# Patient Record
Sex: Male | Born: 1985 | Race: White | Hispanic: No | Marital: Single | Smoking: Never smoker
Health system: Southern US, Community
[De-identification: ages and names within clinical notes are randomized; demographics above are authoritative.]

## PROBLEM LIST (undated history)

## (undated) DIAGNOSIS — N200 Calculus of kidney: Secondary | ICD-10-CM

---

## 2013-11-15 ENCOUNTER — Encounter (HOSPITAL_COMMUNITY): Payer: Self-pay | Admitting: Family Medicine

## 2013-11-15 ENCOUNTER — Emergency Department (HOSPITAL_COMMUNITY)
Admission: EM | Admit: 2013-11-15 | Discharge: 2013-11-15 | Disposition: A | Discharge status: Home / Self Care | Payer: Self-pay | Source: Home / Self Care | Attending: Family Medicine | Admitting: Family Medicine

## 2013-11-15 ENCOUNTER — Emergency Department (INDEPENDENT_AMBULATORY_CARE_PROVIDER_SITE_OTHER): Payer: Self-pay

## 2013-11-15 DIAGNOSIS — R1084 Generalized abdominal pain: Secondary | ICD-10-CM

## 2013-11-15 DIAGNOSIS — N200 Calculus of kidney: Secondary | ICD-10-CM

## 2013-11-15 LAB — POCT URINALYSIS DIP (DEVICE)
GLUCOSE, UA: NEGATIVE mg/dL
Ketones, ur: NEGATIVE mg/dL
Nitrite: NEGATIVE
PH: 6 (ref 5.0–8.0)
Protein, ur: 100 mg/dL — AB
SPECIFIC GRAVITY, URINE: 1.025 (ref 1.005–1.030)
Urobilinogen, UA: 2 mg/dL — ABNORMAL HIGH (ref 0.0–1.0)

## 2013-11-15 MED ORDER — TRAMADOL HCL 50 MG PO TABS: 50.0000 mg | ORAL_TABLET | Freq: Four times a day (QID) | ORAL | Status: AC | PRN

## 2013-11-15 MED ORDER — TAMSULOSIN HCL 0.4 MG PO CAPS: 0.4000 mg | ORAL_CAPSULE | Freq: Every day | ORAL | Status: DC

## 2013-11-15 MED ORDER — KETOROLAC TROMETHAMINE 60 MG/2ML IM SOLN
INTRAMUSCULAR | Status: AC
  Filled 2013-11-15: qty 2

## 2013-11-15 MED ORDER — KETOROLAC TROMETHAMINE 60 MG/2ML IM SOLN
60.0000 mg | Freq: Once | INTRAMUSCULAR | Status: AC
Start: 2013-11-15 — End: 2013-11-15
  Administered 2013-11-15: 60 mg via INTRAMUSCULAR

## 2013-11-15 NOTE — Discharge Instructions (Signed)
Your pain is most likely due to a kidney stone  This will improve once the stone is passed Please strain your urine Please start ibuprofen 600-800mg  every 6-8 hours starting tomorrow morning Please use the tramadol as needed for additional pain Please start the flomax to help flush out the stone Please drink lots of water.  Come back if you are not getting better.    Dietary Guidelines to Help Prevent Kidney Stones Your risk of kidney stones can be decreased by adjusting the foods you eat. The most important thing you can do is drink enough fluid. You should drink enough fluid to keep your urine clear or pale yellow. The following guidelines provide specific information for the type of kidney stone you have had. GUIDELINES ACCORDING TO TYPE OF KIDNEY STONE Calcium Oxalate Kidney Stones  Reduce the amount of salt you eat. Foods that have a lot of salt cause your body to release excess calcium into your urine. The excess calcium can combine with a substance called oxalate to form kidney stones.  Reduce the amount of animal protein you eat if the amount you eat is excessive. Animal protein causes your body to release excess calcium into your urine. Ask your dietitian how much protein from animal sources you should be eating.  Avoid foods that are high in oxalates. If you take vitamins, they should have less than 500 mg of vitamin C. Your body turns vitamin C into oxalates. You do not need to avoid fruits and vegetables high in vitamin C. Calcium Phosphate Kidney Stones  Reduce the amount of salt you eat to help prevent the release of excess calcium into your urine.  Reduce the amount of animal protein you eat if the amount you eat is excessive. Animal protein causes your body to release excess calcium into your urine. Ask your dietitian how much protein from animal sources you should be eating.  Get enough calcium from food or take a calcium supplement (ask your dietitian for recommendations).  Food sources of calcium that do not increase your risk of kidney stones include:  Broccoli.  Dairy products, such as cheese and yogurt.  Pudding. Uric Acid Kidney Stones  Do not have more than 6 oz of animal protein per day. FOOD SOURCES Animal Protein Sources  Meat (all types).  Poultry.  Eggs.  Fish, seafood. Foods High in MirantSalt  Salt seasonings.  Soy sauce.  Teriyaki sauce.  Cured and processed meats.  Salted crackers and snack foods.  Fast food.  Canned soups and most canned foods. Foods High in Oxalates  Grains:  Amaranth.  Barley.  Grits.  Wheat germ.  Bran.  Buckwheat flour.  All bran cereals.  Pretzels.  Whole wheat bread.  Vegetables:  Beans (wax).  Beets and beet greens.  Collard greens.  Eggplant.  Escarole.  Leeks.  Okra.  Parsley.  Rutabagas.  Spinach.  Swiss chard.  Tomato paste.  Fried potatoes.  Sweet potatoes.  Fruits:  Red currants.  Figs.  Kiwi.  Rhubarb.  Meat and Other Protein Sources:  Beans (dried).  Soy burgers and other soybean products.  Miso.  Nuts (peanuts, almonds, pecans, cashews, hazelnuts).  Nut butters.  Sesame seeds and tahini (paste made of sesame seeds).  Poppy seeds.  Beverages:  Chocolate drink mixes.  Soy milk.  Instant iced tea.  Juices made from high-oxalate fruits or vegetables.  Other:  Carob.  Chocolate.  Fruitcake.  Marmalades. Document Released: 08/01/2010 Document Revised: 04/11/2013 Document Reviewed: 03/03/2013 Exit[employer] Patient Information  2015 [employer]. This information is not intended to replace advice given to you by your health care provider. Make sure you discuss any questions you have with your health care provider. ° °

## 2013-11-15 NOTE — ED Notes (Signed)
Pt c/o intermittent right side back pain x3 days Pain has been more constant today; increases w/activity Sx also include vomiting and urinary urgency Denies hematuria, dysuria, f/d Alert w/no signs of acute distress.

## 2013-11-15 NOTE — ED Provider Notes (Signed)
CSN: [id number]     Arrival date & time 11/15/13  0944 History   First MD Initiated Contact with Patient 11/15/13 [phone]     Chief Complaint  Patient presents with  . Back Pain   (Consider location/radiation/quality/duration/timing/severity/associated sxs/prior Treatment) HPI  R flank pain: started Monday. Intermittent first but now constant. Sharp in nature. Worse w/ movement. Some radiation around r side to front of belly. Subjective fevers. Ibuprofen at 04:30 w/ improvement. Initially w/ frequency but no longer. Deneis diarrhea, constipation, penile discharge.    History reviewed. No pertinent past medical history. History reviewed. No pertinent past surgical history. No family history on file. History  Substance Use Topics  . Smoking status: Never Smoker   . Smokeless tobacco: Not on file  . Alcohol Use: No    Review of Systems Per HPI with all other pertinent systems negative.   Allergies  Review of patient's allergies indicates no known allergies.  Home Medications   Prior to Admission medications   Medication Sig Start Date End Date Taking? Authorizing Provider  tamsulosin (FLOMAX) 0.4 MG CAPS capsule Take 1 capsule (0.4 mg total) by mouth daily. 11/15/13   Ozella Rocksavid J Taim Wurm, MD  traMADol (ULTRAM) 50 MG tablet Take 1 tablet (50 mg total) by mouth every 6 (six) hours as needed. 11/15/13   Ozella Rocksavid J Kimbery Harwood, MD   BP 164/97  Pulse 68  Temp(Src) 98.3 F (36.8 C) (Oral)  Resp 14  SpO2 98% Physical Exam  Constitutional: He appears well-developed and well-nourished. No distress.  HENT:  Head: Normocephalic and atraumatic.  Eyes: EOM are normal. Pupils are equal, round, and reactive to light.  Neck: Normal range of motion.  Cardiovascular: Normal rate, normal heart sounds and intact distal pulses.   Pulmonary/Chest: Effort normal and breath sounds normal.  Abdominal: Soft. Bowel sounds are normal. He exhibits no distension and no mass. There is no tenderness. There is no  rebound and no guarding.  Musculoskeletal: Normal range of motion. He exhibits no edema and no tenderness.  Neurological: He is alert.  Skin: Skin is warm. He is not diaphoretic.  Psychiatric: He has a normal mood and affect. His behavior is normal. Thought content normal.    ED Course  Procedures (including critical care time) Labs Review Labs Reviewed  POCT URINALYSIS DIP (DEVICE) - Abnormal; Notable for the following:    Bilirubin Urine SMALL (*)    Hgb urine dipstick LARGE (*)    Protein, ur 100 (*)    Urobilinogen, UA 2.0 (*)    Leukocytes, UA TRACE (*)    All other components within normal limits    Imaging Review No results found.   MDM   1. Kidney stone   2. Generalized abdominal pain    Abd pain most likely from R kidney stone. Strong fmhx of renal stones. UA +. toradol much improved after toradol 60. AXR unremarkable. Start flomax Tramadol PRN pain Urine strainer provided Handout on renal stones given Precautions given and all questions answered  Shelly Flattenavid Shalini Mair, MD Family Medicine 11/15/2013, 10:19 AM       Ozella Rocksavid J Jesscia Imm, MD 11/15/13 1045

## 2015-08-03 IMAGING — CR DG ABDOMEN 1V
2 series · 2 of 2 positions shown · non-contrast
Comparison: None.

CLINICAL DATA: Right-sided back pain

EXAM:
ABDOMEN - 1 VIEW

[view not recorded (1 of 2)]
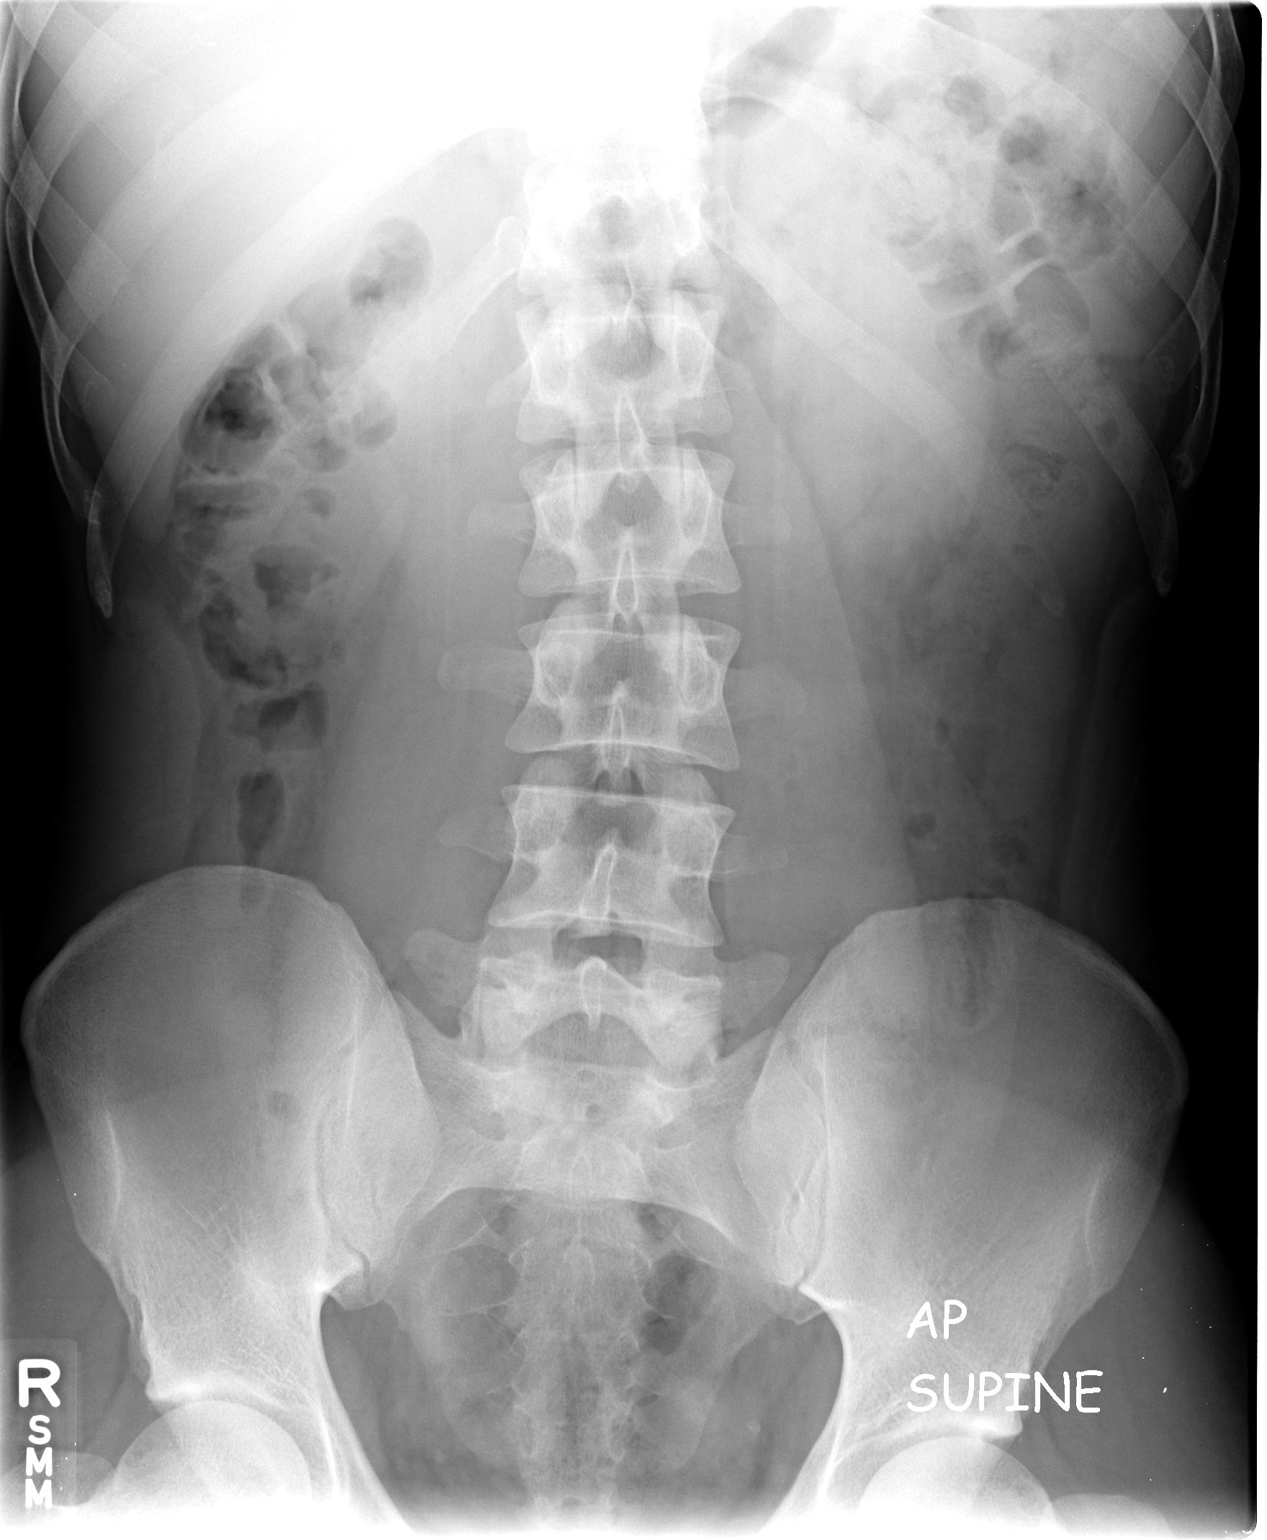

[view not recorded (2 of 2)]
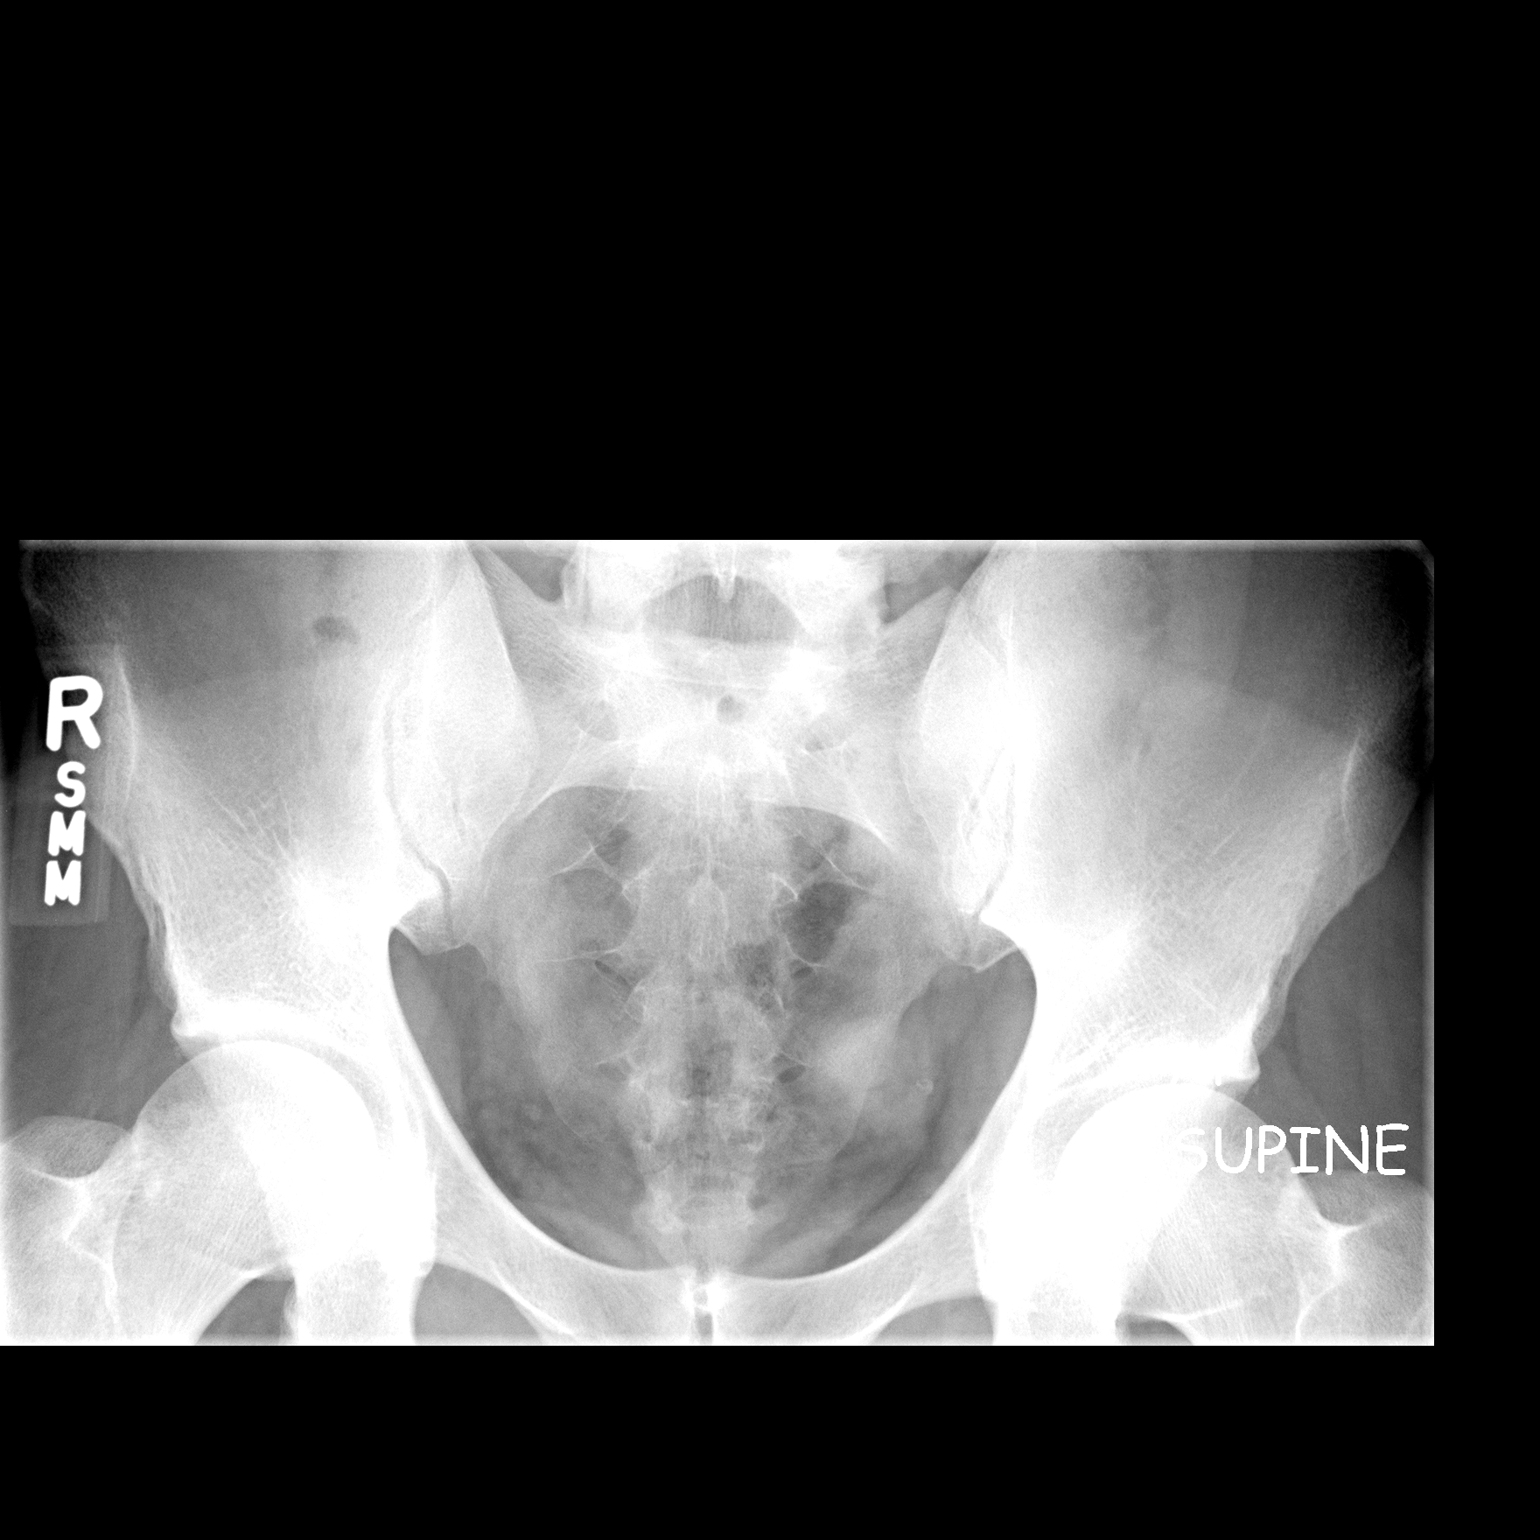

[2 of 2 positions shown; findings below may reference images not displayed]

FINDINGS: Bowel gas pattern is normal without evidence of ileus, obstruction
or free air. No abnormal calcifications overlie the abdomen. In the
pelvis, there is a single calcification on each side that appears
typical of a phlebolith. There is mild curvature of the spine.
IMPRESSION: No acute finding. Single calcification in each side of the pelvis
typical of phleboliths.

## 2018-01-17 ENCOUNTER — Emergency Department (HOSPITAL_COMMUNITY): Payer: 59

## 2018-01-17 ENCOUNTER — Other Ambulatory Visit: Payer: Self-pay

## 2018-01-17 ENCOUNTER — Emergency Department (HOSPITAL_COMMUNITY)
Admission: EM | Admit: 2018-01-17 | Discharge: 2018-01-17 | Disposition: A | Discharge status: Home / Self Care | Payer: 59 | Attending: Emergency Medicine | Admitting: Emergency Medicine

## 2018-01-17 ENCOUNTER — Encounter (HOSPITAL_COMMUNITY): Payer: Self-pay | Admitting: Emergency Medicine

## 2018-01-17 DIAGNOSIS — N201 Calculus of ureter: Secondary | ICD-10-CM | POA: Insufficient documentation

## 2018-01-17 DIAGNOSIS — Z79899 Other long term (current) drug therapy: Secondary | ICD-10-CM | POA: Diagnosis not present

## 2018-01-17 DIAGNOSIS — R109 Unspecified abdominal pain: Secondary | ICD-10-CM | POA: Diagnosis present

## 2018-01-17 DIAGNOSIS — T675XXA Heat exhaustion, unspecified, initial encounter: Secondary | ICD-10-CM | POA: Insufficient documentation

## 2018-01-17 HISTORY — DX: Calculus of kidney: N20.0

## 2018-01-17 LAB — URINALYSIS, ROUTINE W REFLEX MICROSCOPIC
Bacteria, UA: NONE SEEN
Glucose, UA: NEGATIVE mg/dL
KETONES UR: NEGATIVE mg/dL
LEUKOCYTES UA: NEGATIVE
Nitrite: NEGATIVE
PROTEIN: 30 mg/dL — AB
Specific Gravity, Urine: 1.035 — ABNORMAL HIGH (ref 1.005–1.030)
pH: 6 (ref 5.0–8.0)

## 2018-01-17 LAB — CBC WITH DIFFERENTIAL/PLATELET
Abs Immature Granulocytes: 0.1 10*3/uL (ref 0.0–0.1)
BASOS ABS: 0 10*3/uL (ref 0.0–0.1)
Basophils Relative: 0 %
EOS PCT: 1 %
Eosinophils Absolute: 0.1 10*3/uL (ref 0.0–0.7)
HCT: 44.1 % (ref 39.0–52.0)
Hemoglobin: 15.2 g/dL (ref 13.0–17.0)
IMMATURE GRANULOCYTES: 0 %
LYMPHS ABS: 1.1 10*3/uL (ref 0.7–4.0)
LYMPHS PCT: 8 %
MCH: 30.2 pg (ref 26.0–34.0)
MCHC: 34.5 g/dL (ref 30.0–36.0)
MCV: 87.5 fL (ref 78.0–100.0)
MONO ABS: 0.7 10*3/uL (ref 0.1–1.0)
MONOS PCT: 5 %
Neutro Abs: 11.4 10*3/uL — ABNORMAL HIGH (ref 1.7–7.7)
Neutrophils Relative %: 86 %
Platelets: 230 10*3/uL (ref 150–400)
RBC: 5.04 MIL/uL (ref 4.22–5.81)
RDW: 12.6 % (ref 11.5–15.5)
WBC: 13.4 10*3/uL — AB (ref 4.0–10.5)

## 2018-01-17 LAB — COMPREHENSIVE METABOLIC PANEL
ALT: 71 U/L — ABNORMAL HIGH (ref 0–44)
AST: 32 U/L (ref 15–41)
Albumin: 4.7 g/dL (ref 3.5–5.0)
Alkaline Phosphatase: 67 U/L (ref 38–126)
Anion gap: 7 (ref 5–15)
BUN: 13 mg/dL (ref 6–20)
CHLORIDE: 104 mmol/L (ref 98–111)
CO2: 25 mmol/L (ref 22–32)
CREATININE: 1.5 mg/dL — AB (ref 0.61–1.24)
Calcium: 10 mg/dL (ref 8.9–10.3)
GFR calc Af Amer: >60 mL/min (ref >60)
GFR calc non Af Amer: >60 mL/min (ref >60)
GLUCOSE: 159 mg/dL — AB (ref 70–99)
POTASSIUM: 5.3 mmol/L — AB (ref 3.5–5.1)
Sodium: 136 mmol/L (ref 135–145)
Total Bilirubin: 1.2 mg/dL (ref 0.3–1.2)
Total Protein: 7.2 g/dL (ref 6.5–8.1)

## 2018-01-17 LAB — CK: CK TOTAL: 541 U/L — AB (ref 49–397)

## 2018-01-17 MED ORDER — TAMSULOSIN HCL 0.4 MG PO CAPS: ORAL_CAPSULE | ORAL | 0 refills | Status: AC

## 2018-01-17 MED ORDER — SODIUM CHLORIDE 0.9 % IV BOLUS
500.0000 mL | Freq: Once | INTRAVENOUS | Status: AC
  Administered 2018-01-17: 500 mL via INTRAVENOUS

## 2018-01-17 MED ORDER — ONDANSETRON HCL 4 MG/2ML IJ SOLN
4.0000 mg | Freq: Once | INTRAMUSCULAR | Status: AC
  Administered 2018-01-17: 4 mg via INTRAVENOUS
  Filled 2018-01-17: qty 2

## 2018-01-17 MED ORDER — HYDROCODONE-ACETAMINOPHEN 5-325 MG PO TABS: 2.0000 | ORAL_TABLET | Freq: Four times a day (QID) | ORAL | 0 refills | Status: AC | PRN

## 2018-01-17 MED ORDER — SODIUM CHLORIDE 0.9 % IV BOLUS
1000.0000 mL | Freq: Once | INTRAVENOUS | Status: AC
Start: 2018-01-17 — End: 2018-01-17
  Administered 2018-01-17: 1000 mL via INTRAVENOUS

## 2018-01-17 MED ORDER — SODIUM CHLORIDE 0.9 % IV BOLUS
1000.0000 mL | Freq: Once | INTRAVENOUS | Status: AC
  Administered 2018-01-17: 1000 mL via INTRAVENOUS

## 2018-01-17 MED ORDER — KETOROLAC TROMETHAMINE 30 MG/ML IJ SOLN
30.0000 mg | Freq: Once | INTRAMUSCULAR | Status: AC
  Administered 2018-01-17: 30 mg via INTRAVENOUS
  Filled 2018-01-17: qty 1

## 2018-01-17 MED ORDER — ONDANSETRON HCL 4 MG PO TABS: 4.0000 mg | ORAL_TABLET | Freq: Three times a day (TID) | ORAL | 0 refills | Status: AC | PRN

## 2018-01-17 NOTE — ED Notes (Signed)
Patient transported to CT 

## 2018-01-17 NOTE — ED Provider Notes (Signed)
[facility] EMERGENCY DEPARTMENT Provider Note   CSN: [id number] Arrival date & time: 01/17/18  0236  Time seen 03:22 AM   History   Chief Complaint Chief Complaint  Patient presents with  . Flank Pain    HPI Andrew Bauer is a 32 y.o. male.  HPI patient worked outside today in [employer] and it was hot and humid all day.  He presents with some sunburn and states he got overheated today.  He states about mid day he started having some left flank pain that would sometimes radiate into his abdomen however is not doing that in the last several hours.  He states the pain is constant and he describes as a pressure pain.  He has nausea and is vomited twice.  He denies diarrhea or hematuria.  He states laying on his left side makes the pain worse, pacing around makes the pain better.  He states he has had kidney stones twice before and he passed them without intervention.  He does not have a urologist.  PCP Patient, No Pcp Per   Past Medical History:  Diagnosis Date  . Kidney stones     There are no active problems to display for this patient.   History reviewed. No pertinent surgical history.      Home Medications    Prior to Admission medications   Medication Sig Start Date End Date Taking? Authorizing Provider  ibuprofen (ADVIL,MOTRIN) 200 MG tablet Take 200-800 mg by mouth every 6 (six) hours as needed for moderate pain.   Yes [provider]  HYDROcodone-acetaminophen (NORCO/VICODIN) 5-325 MG tablet Take 2 tablets by mouth every 6 (six) hours as needed for moderate pain. 01/17/18   Devoria Albe, MD  ondansetron (ZOFRAN) 4 MG tablet Take 1 tablet (4 mg total) by mouth every 8 (eight) hours as needed for nausea or vomiting. 01/17/18   Devoria Albe, MD  tamsulosin (FLOMAX) 0.4 MG CAPS capsule Take 1 po QD until you pass the stone. 01/17/18   Devoria Albe, MD  traMADol (ULTRAM) 50 MG tablet Take 1 tablet (50 mg total) by mouth every 6 (six) hours as  needed. Patient not taking: Reported on 01/17/2018 11/15/13   Ozella Rocks, MD    Family History No family history on file.  Social History Social History   Tobacco Use  . Smoking status: Never Smoker  . Smokeless tobacco: Never Used  Substance Use Topics  . Alcohol use: Yes  . Drug use: Never  employed Lives with spouse   Allergies   Patient has no known allergies.   Review of Systems Review of Systems  All other systems reviewed and are negative.    Physical Exam Updated Vital Signs BP 124/86 (BP Location: Left Arm)   Pulse 73   Temp 97.9 F (36.6 C) (Oral)   Resp 16   SpO2 96%   Physical Exam  Constitutional: He is oriented to person, place, and time. He appears well-developed and well-nourished.  Non-toxic appearance. He does not appear ill. No distress.  Patient is laying quietly, he is slow in responding to questions.  HENT:  Head: Normocephalic and atraumatic.  Right Ear: External ear normal.  Left Ear: External ear normal.  Nose: Nose normal. No mucosal edema or rhinorrhea.  Mouth/Throat: Oropharynx is clear and moist and mucous membranes are normal. No dental abscesses or uvula swelling.  Eyes: Pupils are equal, round, and reactive to light. Conjunctivae and EOM are normal.  Neck: Normal range of motion  and full passive range of motion without pain. Neck supple.  Cardiovascular: Normal rate, regular rhythm and normal heart sounds. Exam reveals no gallop and no friction rub.  No murmur heard. Pulmonary/Chest: Effort normal and breath sounds normal. No respiratory distress. He has no wheezes. He has no rhonchi. He has no rales. He exhibits no tenderness and no crepitus.  Abdominal: Soft. Normal appearance and bowel sounds are normal. He exhibits no distension. There is no tenderness. There is no rebound and no guarding.  Patient has some pain in the superior aspect of his left flank.  Musculoskeletal: Normal range of motion. He exhibits no edema or  tenderness.  Moves all extremities well.   Neurological: He is alert and oriented to person, place, and time. He has normal strength. No cranial nerve deficit.  Skin: Skin is warm, dry and intact. No rash noted. No erythema. No pallor.  Patient has diffuse redness of his face consistent with sunburn.  Psychiatric: He has a normal mood and affect. His speech is normal and behavior is normal. His mood appears not anxious.  Nursing note and vitals reviewed.    ED Treatments / Results  Labs (all labs ordered are listed, but only abnormal results are displayed) Results for orders placed or performed during the hospital encounter of 01/17/18  Urinalysis, Routine w reflex microscopic- may I&O cath if menses  Result Value Ref Range   Color, Urine YELLOW YELLOW   APPearance CLEAR CLEAR   Specific Gravity, Urine 1.035 (H) 1.005 - 1.030   pH 6.0 5.0 - 8.0   Glucose, UA NEGATIVE NEGATIVE mg/dL   Hgb urine dipstick MODERATE (A) NEGATIVE   Bilirubin Urine SMALL (A) NEGATIVE   Ketones, ur NEGATIVE NEGATIVE mg/dL   Protein, ur 30 (A) NEGATIVE mg/dL   Nitrite NEGATIVE NEGATIVE   Leukocytes, UA NEGATIVE NEGATIVE   RBC / HPF >50 (H) 0 - 5 RBC/hpf   WBC, UA 0-5 0 - 5 WBC/hpf   Bacteria, UA NONE SEEN NONE SEEN   Mucus PRESENT   Comprehensive metabolic panel  Result Value Ref Range   Sodium 136 135 - 145 mmol/L   Potassium 5.3 (H) 3.5 - 5.1 mmol/L   Chloride 104 98 - 111 mmol/L   CO2 25 22 - 32 mmol/L   Glucose, Bld 159 (H) 70 - 99 mg/dL   BUN 13 6 - 20 mg/dL   Creatinine, Ser 1.61 (H) 0.61 - 1.24 mg/dL   Calcium 09.6 8.9 - 04.5 mg/dL   Total Protein 7.2 6.5 - 8.1 g/dL   Albumin 4.7 3.5 - 5.0 g/dL   AST 32 15 - 41 U/L   ALT 71 (H) 0 - 44 U/L   Alkaline Phosphatase 67 38 - 126 U/L   Total Bilirubin 1.2 0.3 - 1.2 mg/dL   GFR calc non Af Amer >60 >60 mL/min   GFR calc Af Amer >60 >60 mL/min   Anion gap 7 5 - 15  CBC with Differential  Result Value Ref Range   WBC 13.4 (H) 4.0 - 10.5 K/uL    RBC 5.04 4.22 - 5.81 MIL/uL   Hemoglobin 15.2 13.0 - 17.0 g/dL   HCT 40.9 81.1 - 91.4 %   MCV 87.5 78.0 - 100.0 fL   MCH 30.2 26.0 - 34.0 pg   MCHC 34.5 30.0 - 36.0 g/dL   RDW 78.2 95.6 - 21.3 %   Platelets 230 150 - 400 K/uL   Neutrophils Relative % 86 %   Neutro Abs  11.4 (H) 1.7 - 7.7 K/uL   Lymphocytes Relative 8 %   Lymphs Abs 1.1 0.7 - 4.0 K/uL   Monocytes Relative 5 %   Monocytes Absolute 0.7 0.1 - 1.0 K/uL   Eosinophils Relative 1 %   Eosinophils Absolute 0.1 0.0 - 0.7 K/uL   Basophils Relative 0 %   Basophils Absolute 0.0 0.0 - 0.1 K/uL   Immature Granulocytes 0 %   Abs Immature Granulocytes 0.1 0.0 - 0.1 K/uL  CK  Result Value Ref Range   Total CK 541 (H) 49 - 397 U/L   Laboratory interpretation all normal except concentrated urine consistent with dehydration, hematuria consistent with possible kidney stone, mild renal insufficiency consistent with dehydration, CK elevated but not in the range to be concern for rhabdomyolysis    EKG None  Radiology Ct Renal Stone Study  Result Date: 01/17/2018 CLINICAL DATA:  Left flank pain.  Recurrent stone disease suspected. EXAM: CT ABDOMEN AND PELVIS WITHOUT CONTRAST TECHNIQUE: Multidetector CT imaging of the abdomen and pelvis was performed following the standard protocol without IV contrast. COMPARISON:  Abdominal radiograph 11/15/2013 FINDINGS: Lower chest: Linear atelectasis in both lower lobes and right middle lobe. Hepatobiliary: No focal liver abnormality is seen. No gallstones, gallbladder wall thickening, or biliary dilatation. Pancreas: No ductal dilatation or inflammation. Spleen: Mild splenomegaly with spleen measuring 13.9 x 14 cm AP by craniocaudal dimension. No focal lesion on noncontrast exam. Adrenals/Urinary Tract: Normal adrenal glands. 2 mm obstructing stone just proximal to the left ureterovesicular junction with mild to moderate hydroureteronephrosis and perinephric edema. Punctate nonobstructing stone in the  interpolar left kidney. Small nonobstructing stones in the right kidney, largest measuring 4 mm. No right hydronephrosis. Urinary bladder is nondistended, no bladder stone. Stomach/Bowel: Stomach is within normal limits. Appendix appears normal, air-filled extending into the right mid abdomen. No evidence of bowel wall thickening, distention, or inflammatory changes. Vascular/Lymphatic: Normal caliber abdominopelvic vessels. No adenopathy. Reproductive: Prostate is unremarkable. Other: No free air, free fluid, or intra-abdominal fluid collection. Musculoskeletal: There are no acute or suspicious osseous abnormalities. IMPRESSION: 1. Obstructing 3 mm stone just proximal to the left ureterovesicular junction with mild to moderate hydroureteronephrosis. 2. Small additional nonobstructing bilateral renal stones. 3. Mild splenomegaly. Electronically Signed   By: Narda Rutherford M.D.   On: 01/17/2018 04:33    Procedures Procedures (including critical care time)  Medications Ordered in ED Medications  sodium chloride 0.9 % bolus 1,000 mL (1,000 mLs Intravenous New Bag/Given 01/17/18 0546)  sodium chloride 0.9 % bolus 1,000 mL (0 mLs Intravenous Stopped 01/17/18 0453)  sodium chloride 0.9 % bolus 500 mL (0 mLs Intravenous Stopped 01/17/18 0453)  ondansetron (ZOFRAN) injection 4 mg (4 mg Intravenous Given 01/17/18 0354)  ketorolac (TORADOL) 30 MG/ML injection 30 mg (30 mg Intravenous Given 01/17/18 0354)     Initial Impression / Assessment and Plan / ED Course  I have reviewed the triage vital signs and the nursing notes.  Pertinent labs & imaging results that were available during my care of the patient were reviewed by me and considered in my medical decision making (see chart for details).     Although fluids not generally given to patients with a kidney stone patient appears to be suffering from some heat related illness.  He was given IV fluids, IV nausea medication.  He was given IV Toradol for his  kidney stone pain.  When I review his old chart he only has a prior abdominal x-ray from July 2015.  CT renal scan was done.  05:10 AM pt sleeping, awakened and states his pain is gone. I discussed his test results with his wife.  She states he had been wanting to drink fluids and I told her that when he wakes up he can have Sprite.  He has not had any urinary output since he got his IVs, he was given another liter of IV fluids.  Recheck at 6:45 AM patient is finishing his last liter of IV fluids.  He has been able to drink without vomiting.  He is having the urge to have urinary output.  His pain is still gone.  At this point he was felt stable for discharge.  Review of the [place] shows no entries for the past 2 years  Final Clinical Impressions(s) / ED Diagnoses   Final diagnoses:  Left ureteral stone  Heat exhaustion, initial encounter    ED Discharge Orders         Ordered    tamsulosin (FLOMAX) 0.4 MG CAPS capsule     01/17/18 0624    ondansetron (ZOFRAN) 4 MG tablet  Every 8 hours PRN     01/17/18 0624    HYDROcodone-acetaminophen (NORCO/VICODIN) 5-325 MG tablet  Every 6 hours PRN     01/17/18 1610           Devoria Albe, MD 01/17/18 [phone]

## 2018-01-17 NOTE — ED Notes (Signed)
ED Provider at bedside. 

## 2018-01-17 NOTE — ED Triage Notes (Signed)
C/o L flank pain since Sunday afternoon with nausea, vomiting, and dysuria.  History of kidney stone.

## 2018-01-17 NOTE — Discharge Instructions (Addendum)
Drink plenty of fluids. Try to drink sports when you are working outside to prevent dehydration. Take the medications as prescribed. Return to the ED if you get fever or have uncontrolled vomiting or pain. You can follow up with a Urologist if you haven't passed the stone in the next week. Cal the office and tell them you have a "3 mm stone near the left UVJ". They will usually see patient's with a kidney stone in the afternoon that same day.
